# Patient Record
Sex: Female | Born: 1940 | Race: White | Hispanic: No | Marital: Married
Health system: Southern US, Community
[De-identification: ages and names within clinical notes are randomized; demographics above are authoritative.]

---

## 2015-11-10 ENCOUNTER — Other Ambulatory Visit (HOSPITAL_COMMUNITY): Payer: Self-pay | Admitting: Family Medicine

## 2015-11-10 DIAGNOSIS — Z1231 Encounter for screening mammogram for malignant neoplasm of breast: Secondary | ICD-10-CM

## 2015-11-25 ENCOUNTER — Ambulatory Visit (HOSPITAL_COMMUNITY)
Admission: RE | Admit: 2015-11-25 | Discharge: 2015-11-25 | Disposition: A | Discharge status: Home / Self Care | Payer: Medicare Other | Source: Ambulatory Visit | Attending: Family Medicine | Admitting: Family Medicine

## 2015-11-25 DIAGNOSIS — Z1231 Encounter for screening mammogram for malignant neoplasm of breast: Secondary | ICD-10-CM | POA: Diagnosis not present

## 2016-05-24 ENCOUNTER — Ambulatory Visit (HOSPITAL_COMMUNITY)
Admission: RE | Admit: 2016-05-24 | Discharge: 2016-05-24 | Disposition: A | Discharge status: Home / Self Care | Payer: Medicare Other | Source: Ambulatory Visit | Attending: Family Medicine | Admitting: Family Medicine

## 2016-05-24 ENCOUNTER — Other Ambulatory Visit (HOSPITAL_COMMUNITY): Payer: Self-pay | Admitting: Family Medicine

## 2016-05-24 DIAGNOSIS — M11261 Other chondrocalcinosis, right knee: Secondary | ICD-10-CM | POA: Diagnosis not present

## 2016-05-24 DIAGNOSIS — M25561 Pain in right knee: Secondary | ICD-10-CM

## 2018-09-14 ENCOUNTER — Other Ambulatory Visit: Payer: Self-pay | Admitting: *Deleted

## 2018-09-14 ENCOUNTER — Encounter: Payer: Self-pay | Admitting: Neurology

## 2018-09-14 DIAGNOSIS — M79641 Pain in right hand: Secondary | ICD-10-CM

## 2018-09-14 DIAGNOSIS — M79642 Pain in left hand: Principal | ICD-10-CM

## 2018-10-09 ENCOUNTER — Ambulatory Visit (INDEPENDENT_AMBULATORY_CARE_PROVIDER_SITE_OTHER): Payer: Medicare Other | Admitting: Neurology

## 2018-10-09 DIAGNOSIS — M79641 Pain in right hand: Secondary | ICD-10-CM

## 2018-10-09 DIAGNOSIS — M79642 Pain in left hand: Secondary | ICD-10-CM

## 2018-10-09 DIAGNOSIS — G5603 Carpal tunnel syndrome, bilateral upper limbs: Secondary | ICD-10-CM

## 2018-10-09 NOTE — Procedures (Signed)
[facility]  [address], [address]  [place], [place] [address] Tel: [phone] Fax:  [phone] Test Date:  10/09/2018  Patient: Brenda Reid DOB: 10/13/1940 Physician: Nita Sickleonika Caspar Favila, DO  Sex: Female Height: 5\' 4"  Ref Phys: Dairl PonderMatthew Weingold, MD  ID#: [id number] Temp: 34.0C Technician:    Patient Complaints: This is a 77 year old female referred for evaluation of bilateral hand paresthesias and weakness.  NCV & EMG Findings: Extensive electrodiagnostic testing of the right upper extremity and additional studies of the left shows:  1. Right median sensory response is absent.  Left median sensory response shows prolonged latency (4.9 ms) and normal amplitude.  Bilateral ulnar sensory responses are within normal limits. 2. Right median motor response is absent when stimulating at the median-wrist, however there is a very small motor response when stimulating the median nerve proximally as well as over the ulnar-wrist, consistent with a Martin-Gruber anastomosis.  Left median motor response shows mildly prolonged latency (4.5 ms) and evidence of a median-to-ulnar crossover in the forearm (i.e. Martin-Gruber anastomosis).  Bilateral ulnar motor responses show symmetrically reduced amplitude at the abductor digit he minimi and normal amplitude at the first dorsal interosseous muscles; ulnar motor latency and conduction velocities are within normal limits. 3. Fibrillation potentials are present in the right abductor pollicis brevis muscle and despite maximal activation, no motor unit recruitment is seen.  Needle electrode examination of the remaining tested muscles in the right and left upper extremities are within normal limits.  Particularly, there is no evidence of chronic motor axonal loss changes affecting any of the ulnar-innervated muscles, suggesting that a low ADM motor amplitude may be normal for patient.    Impression: 1. Right median neuropathy at or distal to the wrist,  consistent with a clinical diagnosis of carpal tunnel syndrome, which is very severe in degree electrically.   2. Left median neuropathy at or distal to the wrist, consistent with a clinical diagnosis of carpal tunnel syndrome, which is moderate in degree electrically.   3. 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   ___________________________ Nita Sickleonika Jaking Thayer, DO    Nerve Conduction Studies Anti Sensory Summary Table   Site NR Peak (ms) Norm Peak (ms) P-T Amp (V) Norm P-T Amp  Left Median Anti Sensory (2nd Digit)  34C  Wrist    4.9 <3.8 13.2 >10  Right Median Anti Sensory (2nd Digit)  34C  Wrist NR  <3.8  >10  Left Ulnar Anti Sensory (5th Digit)  34C  Wrist    3.0 <3.2 30.2 >5  Right Ulnar Anti Sensory (5th Digit)  34C  Wrist    2.7 <3.2 27.3 >5   Motor Summary Table   Site NR Onset (ms) Norm Onset (ms) O-P Amp (mV) Norm O-P Amp Site1 Site2 Delta-0 (ms) Dist (cm) Vel (m/s) Norm Vel (m/s)  Left Median Motor (Abd Poll Brev)  34C  Wrist    4.5 <4.0 5.6 >5 Elbow Wrist 5.3 27.0 51 >50  Elbow    9.8  5.4  Ulnar-wrist crossover Elbow 3.9 0.0    Ulnar-wrist crossover    5.9  3.0         Right Median Motor (Abd Poll Brev)  34C  Wrist NR  <4.0  >5 Elbow Wrist  25.0  >50  Elbow    9.7  1.8  Ulnar-wrist crossover Elbow 5.1 0.0    Ulnar-wrist crossover    4.6  1.8  Left Ulnar Motor (Abd Dig Minimi)  34C  Wrist    2.3 <3.1 4.2 >7 B Elbow Wrist 3.5 21.5 61 >50  B Elbow    5.8  4.6  A Elbow B Elbow 1.9 10.0 53 >50  A Elbow    7.7  4.2         Right Ulnar Motor (Abd Dig Minimi)  34C  Wrist    2.3 <3.1 5.4 >7 B Elbow Wrist 3.5 21.5 61 >50  B Elbow    5.8  5.0  A Elbow B Elbow 1.8 10.0 56 >50  A Elbow    7.6  4.7         Left Ulnar (FDI) Motor (1st DI)  34C  Wrist    3.7 <4.5 7.6 >7 B Elbow Wrist 4.1 21.5 52 >50  B Elbow    7.8  7.2  A Elbow B Elbow 2.0 10.0 50 >50  A Elbow    9.8  6.5         Right Ulnar (FDI) Motor (1st DI)   34C  Wrist    4.1 <4.5 9.4 >7 B Elbow Wrist 3.8 22.5 59 >50  B Elbow    7.9  9.0  A Elbow B Elbow 1.9 10.0 53 >50  A Elbow    9.8  8.4          EMG   Side Muscle Ins Act Fibs Psw Fasc Number Recrt Dur Dur. Amp Amp. Poly Poly. Comment  Right 1stDorInt Nml Nml Nml Nml Nml Nml Nml Nml Nml Nml Nml Nml N/A  Right Abd Poll Brev Nml 1+ Nml Nml NE None - - - - - - ATR  Right PronatorTeres Nml Nml Nml Nml Nml Nml Nml Nml Nml Nml Nml Nml N/A  Right ABD Dig Min Nml Nml Nml Nml Nml Nml Nml Nml Nml Nml Nml Nml N/A  Right FlexCarpiUln Nml Nml Nml Nml Nml Nml Nml Nml Nml Nml Nml Nml N/A  Right Biceps Nml Nml Nml Nml Nml Nml Nml Nml Nml Nml Nml Nml N/A  Right Triceps Nml Nml Nml Nml Nml Nml Nml Nml Nml Nml Nml Nml N/A  Right Deltoid Nml Nml Nml Nml Nml Nml Nml Nml Nml Nml Nml Nml N/A  Left 1stDorInt Nml Nml Nml Nml Nml Nml Nml Nml Nml Nml Nml Nml N/A  Left Abd Poll Brev Nml Nml Nml Nml Nml Nml Nml Nml Nml Nml Nml Nml N/A  Left PronatorTeres Nml Nml Nml Nml Nml Nml Nml Nml Nml Nml Nml Nml N/A  Left Biceps Nml Nml Nml Nml Nml Nml Nml Nml Nml Nml Nml Nml N/A  Left Triceps Nml Nml Nml Nml Nml Nml Nml Nml Nml Nml Nml Nml N/A  Left Deltoid Nml Nml Nml Nml Nml Nml Nml Nml Nml Nml Nml Nml N/A  Left ABD Dig Min Nml Nml Nml Nml Nml Nml Nml Nml Nml Nml Nml Nml N/A  Left FlexCarpiUln Nml Nml Nml Nml Nml Nml Nml Nml Nml Nml Nml Nml N/A      Waveforms:

## 2019-09-23 ENCOUNTER — Other Ambulatory Visit: Payer: Self-pay

## 2019-09-23 ENCOUNTER — Other Ambulatory Visit (HOSPITAL_COMMUNITY): Payer: Self-pay | Admitting: Nurse Practitioner

## 2019-09-23 ENCOUNTER — Encounter (HOSPITAL_COMMUNITY): Payer: Self-pay

## 2019-09-23 ENCOUNTER — Ambulatory Visit (HOSPITAL_COMMUNITY)
Admission: RE | Admit: 2019-09-23 | Discharge: 2019-09-23 | Disposition: A | Discharge status: Home / Self Care | Payer: Medicare Other | Source: Ambulatory Visit | Attending: Nurse Practitioner | Admitting: Nurse Practitioner

## 2019-09-23 DIAGNOSIS — M5440 Lumbago with sciatica, unspecified side: Secondary | ICD-10-CM

## 2019-09-23 DIAGNOSIS — R059 Cough, unspecified: Secondary | ICD-10-CM

## 2019-09-23 DIAGNOSIS — R05 Cough: Secondary | ICD-10-CM

## 2020-04-08 ENCOUNTER — Encounter (INDEPENDENT_AMBULATORY_CARE_PROVIDER_SITE_OTHER): Payer: Medicare Other | Admitting: Ophthalmology

## 2020-04-08 ENCOUNTER — Other Ambulatory Visit: Payer: Self-pay

## 2020-04-08 DIAGNOSIS — H353122 Nonexudative age-related macular degeneration, left eye, intermediate dry stage: Secondary | ICD-10-CM | POA: Diagnosis not present

## 2020-04-08 DIAGNOSIS — H353211 Exudative age-related macular degeneration, right eye, with active choroidal neovascularization: Secondary | ICD-10-CM | POA: Diagnosis not present

## 2020-04-08 DIAGNOSIS — H43813 Vitreous degeneration, bilateral: Secondary | ICD-10-CM

## 2020-05-07 ENCOUNTER — Other Ambulatory Visit: Payer: Self-pay

## 2020-05-07 ENCOUNTER — Encounter (INDEPENDENT_AMBULATORY_CARE_PROVIDER_SITE_OTHER): Payer: Medicare Other | Admitting: Ophthalmology

## 2020-05-07 DIAGNOSIS — H2513 Age-related nuclear cataract, bilateral: Secondary | ICD-10-CM

## 2020-05-07 DIAGNOSIS — H43813 Vitreous degeneration, bilateral: Secondary | ICD-10-CM

## 2020-05-07 DIAGNOSIS — H353211 Exudative age-related macular degeneration, right eye, with active choroidal neovascularization: Secondary | ICD-10-CM | POA: Diagnosis not present

## 2020-05-07 DIAGNOSIS — H353121 Nonexudative age-related macular degeneration, left eye, early dry stage: Secondary | ICD-10-CM | POA: Diagnosis not present

## 2020-06-03 ENCOUNTER — Encounter (INDEPENDENT_AMBULATORY_CARE_PROVIDER_SITE_OTHER): Payer: Medicare Other | Admitting: Ophthalmology

## 2020-06-03 ENCOUNTER — Other Ambulatory Visit: Payer: Self-pay

## 2020-06-03 DIAGNOSIS — H43813 Vitreous degeneration, bilateral: Secondary | ICD-10-CM | POA: Diagnosis not present

## 2020-06-03 DIAGNOSIS — H353211 Exudative age-related macular degeneration, right eye, with active choroidal neovascularization: Secondary | ICD-10-CM

## 2020-06-03 DIAGNOSIS — H353122 Nonexudative age-related macular degeneration, left eye, intermediate dry stage: Secondary | ICD-10-CM | POA: Diagnosis not present

## 2020-06-03 DIAGNOSIS — H2513 Age-related nuclear cataract, bilateral: Secondary | ICD-10-CM | POA: Diagnosis not present

## 2020-07-02 ENCOUNTER — Other Ambulatory Visit: Payer: Self-pay

## 2020-07-02 ENCOUNTER — Encounter (INDEPENDENT_AMBULATORY_CARE_PROVIDER_SITE_OTHER): Payer: Medicare Other | Admitting: Ophthalmology

## 2020-07-02 DIAGNOSIS — H353211 Exudative age-related macular degeneration, right eye, with active choroidal neovascularization: Secondary | ICD-10-CM | POA: Diagnosis not present

## 2020-07-02 DIAGNOSIS — H353122 Nonexudative age-related macular degeneration, left eye, intermediate dry stage: Secondary | ICD-10-CM | POA: Diagnosis not present

## 2020-07-02 DIAGNOSIS — H43813 Vitreous degeneration, bilateral: Secondary | ICD-10-CM

## 2020-07-28 ENCOUNTER — Encounter (INDEPENDENT_AMBULATORY_CARE_PROVIDER_SITE_OTHER): Payer: Medicare Other | Admitting: Ophthalmology

## 2020-07-28 ENCOUNTER — Other Ambulatory Visit: Payer: Self-pay

## 2020-07-28 DIAGNOSIS — H353211 Exudative age-related macular degeneration, right eye, with active choroidal neovascularization: Secondary | ICD-10-CM

## 2020-07-28 DIAGNOSIS — H43813 Vitreous degeneration, bilateral: Secondary | ICD-10-CM

## 2020-07-28 DIAGNOSIS — H353122 Nonexudative age-related macular degeneration, left eye, intermediate dry stage: Secondary | ICD-10-CM | POA: Diagnosis not present

## 2020-08-27 ENCOUNTER — Encounter (INDEPENDENT_AMBULATORY_CARE_PROVIDER_SITE_OTHER): Payer: Medicare Other | Admitting: Ophthalmology

## 2020-08-27 ENCOUNTER — Other Ambulatory Visit: Payer: Self-pay

## 2020-08-27 DIAGNOSIS — H353121 Nonexudative age-related macular degeneration, left eye, early dry stage: Secondary | ICD-10-CM

## 2020-08-27 DIAGNOSIS — H43813 Vitreous degeneration, bilateral: Secondary | ICD-10-CM

## 2020-08-27 DIAGNOSIS — H353211 Exudative age-related macular degeneration, right eye, with active choroidal neovascularization: Secondary | ICD-10-CM | POA: Diagnosis not present

## 2020-09-24 ENCOUNTER — Other Ambulatory Visit: Payer: Self-pay

## 2020-09-24 ENCOUNTER — Encounter (INDEPENDENT_AMBULATORY_CARE_PROVIDER_SITE_OTHER): Payer: Medicare Other | Admitting: Ophthalmology

## 2020-09-24 DIAGNOSIS — H353121 Nonexudative age-related macular degeneration, left eye, early dry stage: Secondary | ICD-10-CM

## 2020-09-24 DIAGNOSIS — H43813 Vitreous degeneration, bilateral: Secondary | ICD-10-CM

## 2020-09-24 DIAGNOSIS — H353211 Exudative age-related macular degeneration, right eye, with active choroidal neovascularization: Secondary | ICD-10-CM | POA: Diagnosis not present

## 2020-10-22 ENCOUNTER — Encounter (INDEPENDENT_AMBULATORY_CARE_PROVIDER_SITE_OTHER): Payer: Medicare Other | Admitting: Ophthalmology

## 2020-10-22 ENCOUNTER — Other Ambulatory Visit: Payer: Self-pay

## 2020-10-22 DIAGNOSIS — H353211 Exudative age-related macular degeneration, right eye, with active choroidal neovascularization: Secondary | ICD-10-CM | POA: Diagnosis not present

## 2020-10-22 DIAGNOSIS — H353122 Nonexudative age-related macular degeneration, left eye, intermediate dry stage: Secondary | ICD-10-CM | POA: Diagnosis not present

## 2020-10-22 DIAGNOSIS — H43813 Vitreous degeneration, bilateral: Secondary | ICD-10-CM | POA: Diagnosis not present

## 2020-11-18 ENCOUNTER — Encounter (INDEPENDENT_AMBULATORY_CARE_PROVIDER_SITE_OTHER): Payer: Medicare Other | Admitting: Ophthalmology

## 2020-11-18 ENCOUNTER — Other Ambulatory Visit: Payer: Self-pay

## 2020-11-18 DIAGNOSIS — H353122 Nonexudative age-related macular degeneration, left eye, intermediate dry stage: Secondary | ICD-10-CM | POA: Diagnosis not present

## 2020-11-18 DIAGNOSIS — H353211 Exudative age-related macular degeneration, right eye, with active choroidal neovascularization: Secondary | ICD-10-CM | POA: Diagnosis not present

## 2020-11-18 DIAGNOSIS — H43813 Vitreous degeneration, bilateral: Secondary | ICD-10-CM | POA: Diagnosis not present

## 2020-12-16 ENCOUNTER — Other Ambulatory Visit: Payer: Self-pay

## 2020-12-16 ENCOUNTER — Encounter (INDEPENDENT_AMBULATORY_CARE_PROVIDER_SITE_OTHER): Payer: Medicare Other | Admitting: Ophthalmology

## 2020-12-16 DIAGNOSIS — H43813 Vitreous degeneration, bilateral: Secondary | ICD-10-CM

## 2020-12-16 DIAGNOSIS — H353211 Exudative age-related macular degeneration, right eye, with active choroidal neovascularization: Secondary | ICD-10-CM

## 2020-12-16 DIAGNOSIS — H353122 Nonexudative age-related macular degeneration, left eye, intermediate dry stage: Secondary | ICD-10-CM

## 2021-01-13 ENCOUNTER — Encounter (INDEPENDENT_AMBULATORY_CARE_PROVIDER_SITE_OTHER): Payer: Medicare Other | Admitting: Ophthalmology

## 2021-01-13 ENCOUNTER — Other Ambulatory Visit: Payer: Self-pay

## 2021-01-13 DIAGNOSIS — H43813 Vitreous degeneration, bilateral: Secondary | ICD-10-CM | POA: Diagnosis not present

## 2021-01-13 DIAGNOSIS — H353211 Exudative age-related macular degeneration, right eye, with active choroidal neovascularization: Secondary | ICD-10-CM | POA: Diagnosis not present

## 2021-01-13 DIAGNOSIS — H353122 Nonexudative age-related macular degeneration, left eye, intermediate dry stage: Secondary | ICD-10-CM

## 2021-02-10 ENCOUNTER — Other Ambulatory Visit: Payer: Self-pay

## 2021-02-10 ENCOUNTER — Encounter (INDEPENDENT_AMBULATORY_CARE_PROVIDER_SITE_OTHER): Payer: Medicare Other | Admitting: Ophthalmology

## 2021-02-10 DIAGNOSIS — H353211 Exudative age-related macular degeneration, right eye, with active choroidal neovascularization: Secondary | ICD-10-CM | POA: Diagnosis not present

## 2021-02-10 DIAGNOSIS — H353122 Nonexudative age-related macular degeneration, left eye, intermediate dry stage: Secondary | ICD-10-CM | POA: Diagnosis not present

## 2021-02-10 DIAGNOSIS — H43813 Vitreous degeneration, bilateral: Secondary | ICD-10-CM

## 2021-03-11 ENCOUNTER — Encounter (INDEPENDENT_AMBULATORY_CARE_PROVIDER_SITE_OTHER): Payer: Medicare Other | Admitting: Ophthalmology

## 2021-03-11 ENCOUNTER — Other Ambulatory Visit: Payer: Self-pay

## 2021-03-11 DIAGNOSIS — H43813 Vitreous degeneration, bilateral: Secondary | ICD-10-CM

## 2021-03-11 DIAGNOSIS — H353211 Exudative age-related macular degeneration, right eye, with active choroidal neovascularization: Secondary | ICD-10-CM

## 2021-03-11 DIAGNOSIS — H353122 Nonexudative age-related macular degeneration, left eye, intermediate dry stage: Secondary | ICD-10-CM

## 2021-04-08 ENCOUNTER — Other Ambulatory Visit: Payer: Self-pay

## 2021-04-08 ENCOUNTER — Encounter (INDEPENDENT_AMBULATORY_CARE_PROVIDER_SITE_OTHER): Payer: Medicare Other | Admitting: Ophthalmology

## 2021-04-08 DIAGNOSIS — H43813 Vitreous degeneration, bilateral: Secondary | ICD-10-CM

## 2021-04-08 DIAGNOSIS — H353211 Exudative age-related macular degeneration, right eye, with active choroidal neovascularization: Secondary | ICD-10-CM

## 2021-04-08 DIAGNOSIS — H353122 Nonexudative age-related macular degeneration, left eye, intermediate dry stage: Secondary | ICD-10-CM | POA: Diagnosis not present

## 2021-05-07 ENCOUNTER — Encounter (INDEPENDENT_AMBULATORY_CARE_PROVIDER_SITE_OTHER): Payer: Medicare Other | Admitting: Ophthalmology

## 2021-05-17 ENCOUNTER — Encounter (INDEPENDENT_AMBULATORY_CARE_PROVIDER_SITE_OTHER): Payer: Medicare Other | Admitting: Ophthalmology

## 2021-05-17 ENCOUNTER — Other Ambulatory Visit: Payer: Self-pay

## 2021-05-17 DIAGNOSIS — H353211 Exudative age-related macular degeneration, right eye, with active choroidal neovascularization: Secondary | ICD-10-CM | POA: Diagnosis not present

## 2021-05-17 DIAGNOSIS — H353122 Nonexudative age-related macular degeneration, left eye, intermediate dry stage: Secondary | ICD-10-CM | POA: Diagnosis not present

## 2021-05-17 DIAGNOSIS — H43813 Vitreous degeneration, bilateral: Secondary | ICD-10-CM

## 2021-05-21 IMAGING — DX DG THORACIC SPINE 3V
4 series · 4 of 4 positions shown · non-contrast
Comparison: None.

CLINICAL DATA: Upper back pain

EXAM:
THORACIC SPINE - 3 VIEWS

[t-spine ap (1 of 2)]
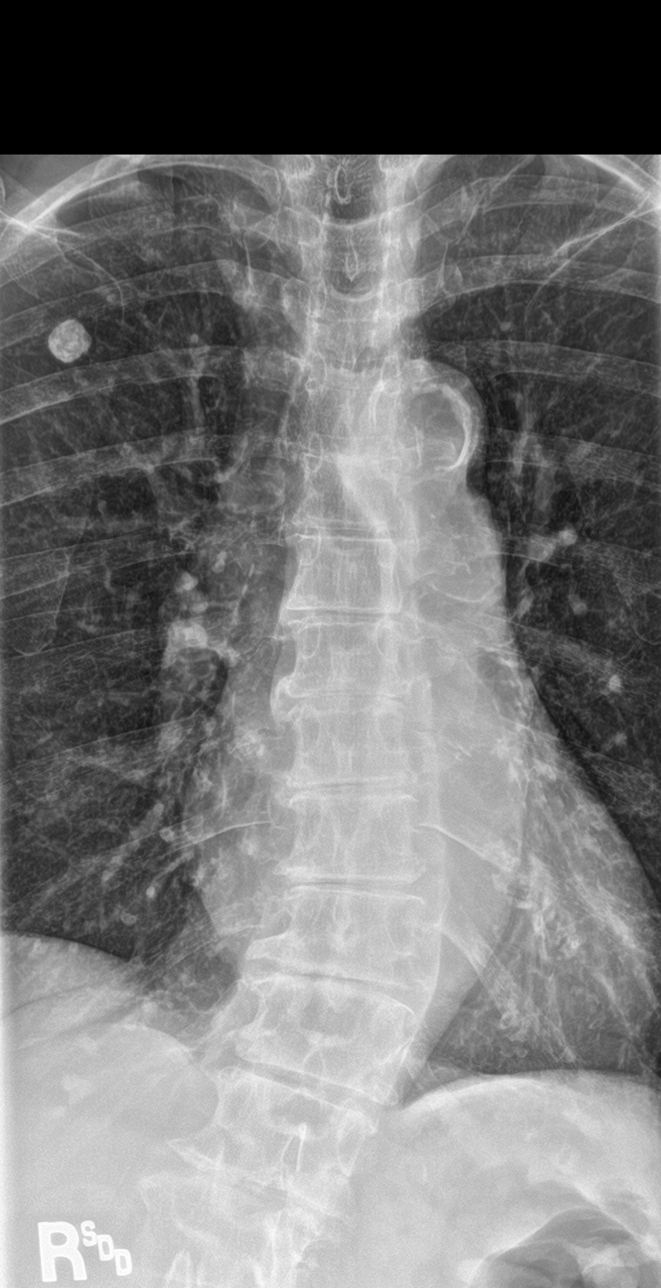

[t-spine swimmers]
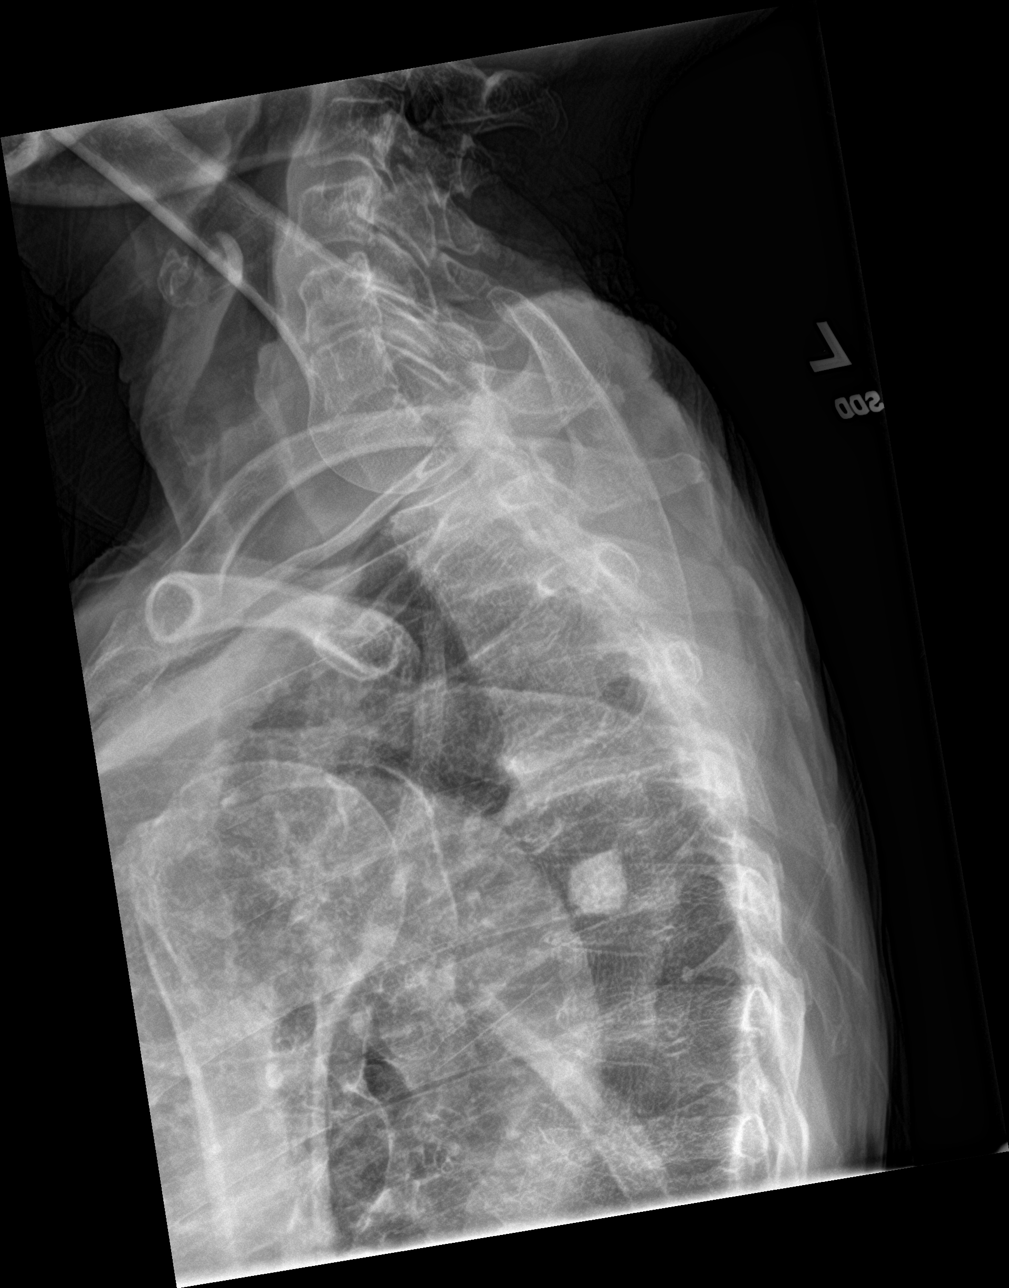

[t-spine ap (2 of 2)]
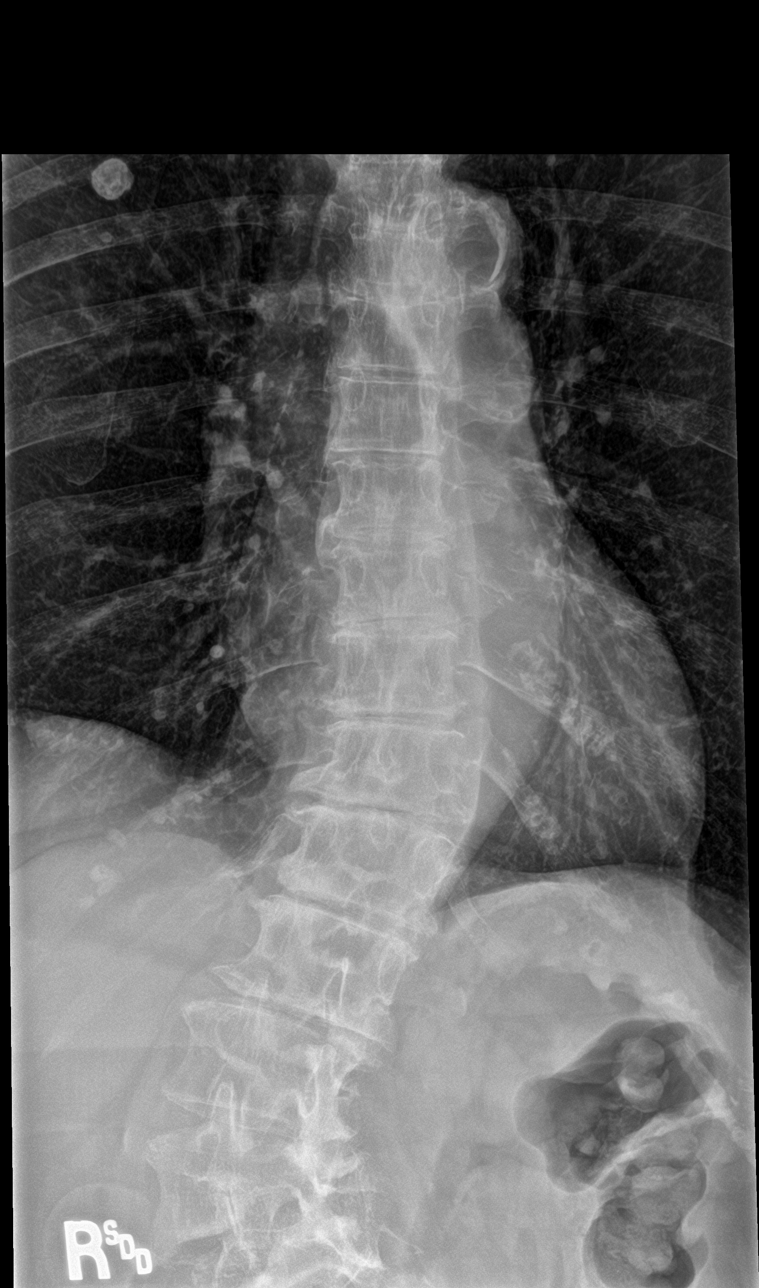

[t-spine lat]
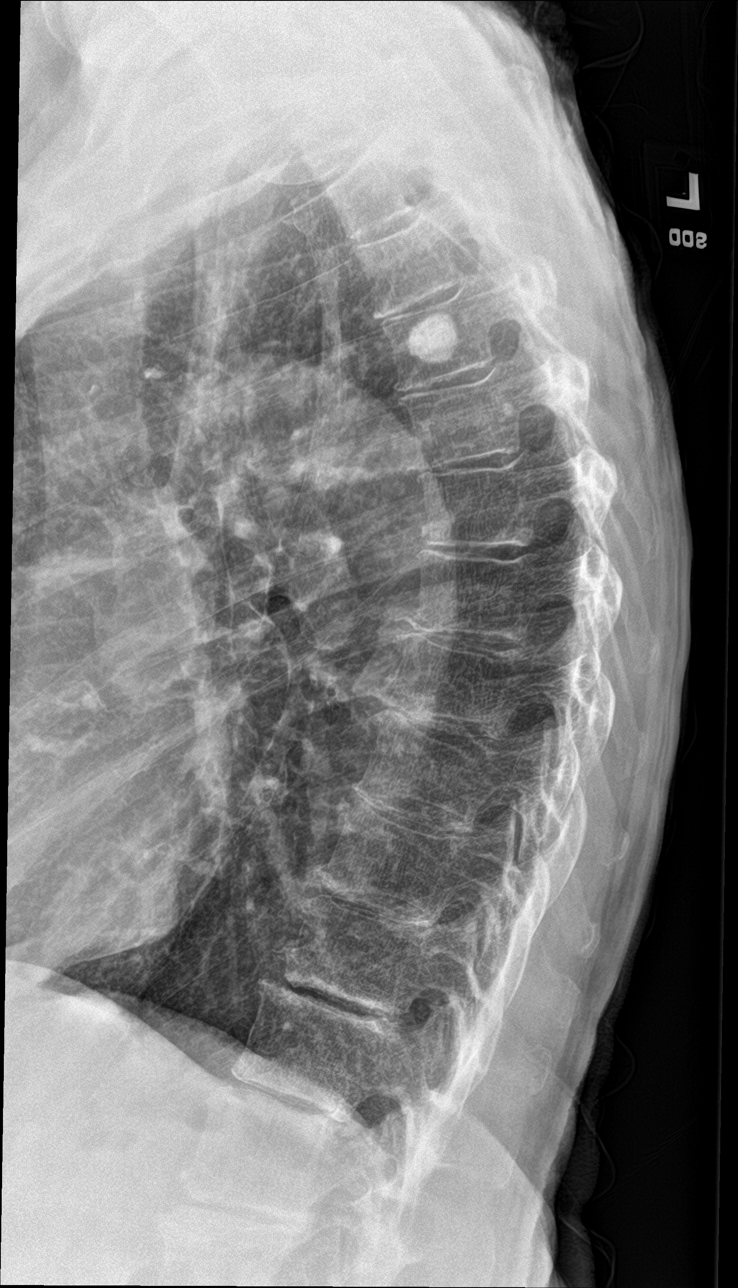

[4 of 4 positions shown; findings below may reference images not displayed]

FINDINGS: Levocurvature of the lower thoracic spine with dextrocurvature of
the visualized upper lumbar spine. Vertebral body heights are
maintained. There is mild lower thoracic retrolisthesis likely
secondary to facet arthropathy. Mild multilevel disc height loss.
Chest is dictated separately.
IMPRESSION: Sigmoid curvature of the thoracolumbar spine. Multilevel
degenerative changes.

## 2021-06-15 ENCOUNTER — Encounter (INDEPENDENT_AMBULATORY_CARE_PROVIDER_SITE_OTHER): Payer: Medicare Other | Admitting: Ophthalmology

## 2021-06-15 ENCOUNTER — Other Ambulatory Visit: Payer: Self-pay

## 2021-06-15 DIAGNOSIS — H353122 Nonexudative age-related macular degeneration, left eye, intermediate dry stage: Secondary | ICD-10-CM | POA: Diagnosis not present

## 2021-06-15 DIAGNOSIS — H353211 Exudative age-related macular degeneration, right eye, with active choroidal neovascularization: Secondary | ICD-10-CM

## 2021-06-15 DIAGNOSIS — H43813 Vitreous degeneration, bilateral: Secondary | ICD-10-CM | POA: Diagnosis not present

## 2021-07-14 ENCOUNTER — Encounter (INDEPENDENT_AMBULATORY_CARE_PROVIDER_SITE_OTHER): Payer: Medicare Other | Admitting: Ophthalmology

## 2021-07-14 ENCOUNTER — Other Ambulatory Visit: Payer: Self-pay

## 2021-07-14 DIAGNOSIS — H43813 Vitreous degeneration, bilateral: Secondary | ICD-10-CM

## 2021-07-14 DIAGNOSIS — H353121 Nonexudative age-related macular degeneration, left eye, early dry stage: Secondary | ICD-10-CM | POA: Diagnosis not present

## 2021-07-14 DIAGNOSIS — H353211 Exudative age-related macular degeneration, right eye, with active choroidal neovascularization: Secondary | ICD-10-CM | POA: Diagnosis not present

## 2021-08-11 ENCOUNTER — Other Ambulatory Visit: Payer: Self-pay

## 2021-08-11 ENCOUNTER — Encounter (INDEPENDENT_AMBULATORY_CARE_PROVIDER_SITE_OTHER): Payer: Medicare Other | Admitting: Ophthalmology

## 2021-08-11 DIAGNOSIS — H353211 Exudative age-related macular degeneration, right eye, with active choroidal neovascularization: Secondary | ICD-10-CM

## 2021-08-11 DIAGNOSIS — H353122 Nonexudative age-related macular degeneration, left eye, intermediate dry stage: Secondary | ICD-10-CM

## 2021-08-11 DIAGNOSIS — H43813 Vitreous degeneration, bilateral: Secondary | ICD-10-CM | POA: Diagnosis not present

## 2021-09-08 ENCOUNTER — Other Ambulatory Visit: Payer: Self-pay

## 2021-09-08 ENCOUNTER — Encounter (INDEPENDENT_AMBULATORY_CARE_PROVIDER_SITE_OTHER): Payer: Medicare Other | Admitting: Ophthalmology

## 2021-09-08 DIAGNOSIS — H43813 Vitreous degeneration, bilateral: Secondary | ICD-10-CM

## 2021-09-08 DIAGNOSIS — H353122 Nonexudative age-related macular degeneration, left eye, intermediate dry stage: Secondary | ICD-10-CM

## 2021-09-08 DIAGNOSIS — H353211 Exudative age-related macular degeneration, right eye, with active choroidal neovascularization: Secondary | ICD-10-CM | POA: Diagnosis not present

## 2021-10-13 ENCOUNTER — Encounter (INDEPENDENT_AMBULATORY_CARE_PROVIDER_SITE_OTHER): Payer: Medicare Other | Admitting: Ophthalmology

## 2021-10-13 ENCOUNTER — Other Ambulatory Visit: Payer: Self-pay

## 2021-10-13 DIAGNOSIS — H43813 Vitreous degeneration, bilateral: Secondary | ICD-10-CM

## 2021-10-13 DIAGNOSIS — H353122 Nonexudative age-related macular degeneration, left eye, intermediate dry stage: Secondary | ICD-10-CM | POA: Diagnosis not present

## 2021-10-13 DIAGNOSIS — H353211 Exudative age-related macular degeneration, right eye, with active choroidal neovascularization: Secondary | ICD-10-CM | POA: Diagnosis not present

## 2021-11-10 ENCOUNTER — Encounter (INDEPENDENT_AMBULATORY_CARE_PROVIDER_SITE_OTHER): Payer: Medicare Other | Admitting: Ophthalmology

## 2021-11-10 ENCOUNTER — Other Ambulatory Visit: Payer: Self-pay

## 2021-11-10 DIAGNOSIS — H353121 Nonexudative age-related macular degeneration, left eye, early dry stage: Secondary | ICD-10-CM | POA: Diagnosis not present

## 2021-11-10 DIAGNOSIS — H43813 Vitreous degeneration, bilateral: Secondary | ICD-10-CM

## 2021-11-10 DIAGNOSIS — H353211 Exudative age-related macular degeneration, right eye, with active choroidal neovascularization: Secondary | ICD-10-CM | POA: Diagnosis not present

## 2021-12-08 ENCOUNTER — Other Ambulatory Visit: Payer: Self-pay

## 2021-12-08 ENCOUNTER — Encounter (INDEPENDENT_AMBULATORY_CARE_PROVIDER_SITE_OTHER): Payer: Medicare Other | Admitting: Ophthalmology

## 2021-12-08 DIAGNOSIS — H353211 Exudative age-related macular degeneration, right eye, with active choroidal neovascularization: Secondary | ICD-10-CM

## 2021-12-08 DIAGNOSIS — H353122 Nonexudative age-related macular degeneration, left eye, intermediate dry stage: Secondary | ICD-10-CM

## 2021-12-08 DIAGNOSIS — H43813 Vitreous degeneration, bilateral: Secondary | ICD-10-CM

## 2022-01-05 ENCOUNTER — Encounter (INDEPENDENT_AMBULATORY_CARE_PROVIDER_SITE_OTHER): Payer: Medicare Other | Admitting: Ophthalmology

## 2022-01-05 DIAGNOSIS — H353211 Exudative age-related macular degeneration, right eye, with active choroidal neovascularization: Secondary | ICD-10-CM

## 2022-01-05 DIAGNOSIS — H43813 Vitreous degeneration, bilateral: Secondary | ICD-10-CM | POA: Diagnosis not present

## 2022-01-05 DIAGNOSIS — H353122 Nonexudative age-related macular degeneration, left eye, intermediate dry stage: Secondary | ICD-10-CM | POA: Diagnosis not present

## 2022-02-02 ENCOUNTER — Encounter (INDEPENDENT_AMBULATORY_CARE_PROVIDER_SITE_OTHER): Payer: Medicare Other | Admitting: Ophthalmology

## 2022-02-02 DIAGNOSIS — H43813 Vitreous degeneration, bilateral: Secondary | ICD-10-CM

## 2022-02-02 DIAGNOSIS — H353211 Exudative age-related macular degeneration, right eye, with active choroidal neovascularization: Secondary | ICD-10-CM | POA: Diagnosis not present

## 2022-02-02 DIAGNOSIS — H353122 Nonexudative age-related macular degeneration, left eye, intermediate dry stage: Secondary | ICD-10-CM | POA: Diagnosis not present

## 2022-03-02 ENCOUNTER — Encounter (INDEPENDENT_AMBULATORY_CARE_PROVIDER_SITE_OTHER): Payer: Medicare Other | Admitting: Ophthalmology

## 2022-03-02 DIAGNOSIS — H353211 Exudative age-related macular degeneration, right eye, with active choroidal neovascularization: Secondary | ICD-10-CM | POA: Diagnosis not present

## 2022-03-02 DIAGNOSIS — H43813 Vitreous degeneration, bilateral: Secondary | ICD-10-CM

## 2022-03-02 DIAGNOSIS — H353122 Nonexudative age-related macular degeneration, left eye, intermediate dry stage: Secondary | ICD-10-CM | POA: Diagnosis not present

## 2022-03-30 ENCOUNTER — Encounter (INDEPENDENT_AMBULATORY_CARE_PROVIDER_SITE_OTHER): Payer: Medicare Other | Admitting: Ophthalmology

## 2022-03-30 DIAGNOSIS — H43813 Vitreous degeneration, bilateral: Secondary | ICD-10-CM

## 2022-03-30 DIAGNOSIS — H353122 Nonexudative age-related macular degeneration, left eye, intermediate dry stage: Secondary | ICD-10-CM | POA: Diagnosis not present

## 2022-03-30 DIAGNOSIS — H353211 Exudative age-related macular degeneration, right eye, with active choroidal neovascularization: Secondary | ICD-10-CM

## 2022-04-27 ENCOUNTER — Encounter (INDEPENDENT_AMBULATORY_CARE_PROVIDER_SITE_OTHER): Payer: Medicare Other | Admitting: Ophthalmology

## 2022-04-27 DIAGNOSIS — H353211 Exudative age-related macular degeneration, right eye, with active choroidal neovascularization: Secondary | ICD-10-CM

## 2022-04-27 DIAGNOSIS — H353122 Nonexudative age-related macular degeneration, left eye, intermediate dry stage: Secondary | ICD-10-CM

## 2022-04-27 DIAGNOSIS — H43813 Vitreous degeneration, bilateral: Secondary | ICD-10-CM | POA: Diagnosis not present

## 2022-05-25 ENCOUNTER — Encounter (INDEPENDENT_AMBULATORY_CARE_PROVIDER_SITE_OTHER): Payer: Medicare Other | Admitting: Ophthalmology

## 2022-05-25 DIAGNOSIS — H353211 Exudative age-related macular degeneration, right eye, with active choroidal neovascularization: Secondary | ICD-10-CM

## 2022-05-25 DIAGNOSIS — H353122 Nonexudative age-related macular degeneration, left eye, intermediate dry stage: Secondary | ICD-10-CM | POA: Diagnosis not present

## 2022-05-25 DIAGNOSIS — H43813 Vitreous degeneration, bilateral: Secondary | ICD-10-CM

## 2022-06-22 ENCOUNTER — Encounter (INDEPENDENT_AMBULATORY_CARE_PROVIDER_SITE_OTHER): Payer: Medicare Other | Admitting: Ophthalmology

## 2022-06-22 DIAGNOSIS — H43813 Vitreous degeneration, bilateral: Secondary | ICD-10-CM

## 2022-06-22 DIAGNOSIS — H353122 Nonexudative age-related macular degeneration, left eye, intermediate dry stage: Secondary | ICD-10-CM | POA: Diagnosis not present

## 2022-06-22 DIAGNOSIS — H353211 Exudative age-related macular degeneration, right eye, with active choroidal neovascularization: Secondary | ICD-10-CM

## 2022-07-20 ENCOUNTER — Encounter (INDEPENDENT_AMBULATORY_CARE_PROVIDER_SITE_OTHER): Payer: Medicare Other | Admitting: Ophthalmology

## 2022-07-20 DIAGNOSIS — H353122 Nonexudative age-related macular degeneration, left eye, intermediate dry stage: Secondary | ICD-10-CM

## 2022-07-20 DIAGNOSIS — H353211 Exudative age-related macular degeneration, right eye, with active choroidal neovascularization: Secondary | ICD-10-CM | POA: Diagnosis not present

## 2022-07-20 DIAGNOSIS — H43813 Vitreous degeneration, bilateral: Secondary | ICD-10-CM

## 2022-08-17 ENCOUNTER — Encounter (INDEPENDENT_AMBULATORY_CARE_PROVIDER_SITE_OTHER): Payer: Medicare Other | Admitting: Ophthalmology

## 2022-08-17 DIAGNOSIS — H353211 Exudative age-related macular degeneration, right eye, with active choroidal neovascularization: Secondary | ICD-10-CM | POA: Diagnosis not present

## 2022-08-17 DIAGNOSIS — H43813 Vitreous degeneration, bilateral: Secondary | ICD-10-CM | POA: Diagnosis not present

## 2022-08-17 DIAGNOSIS — H353122 Nonexudative age-related macular degeneration, left eye, intermediate dry stage: Secondary | ICD-10-CM | POA: Diagnosis not present

## 2022-09-14 ENCOUNTER — Encounter (INDEPENDENT_AMBULATORY_CARE_PROVIDER_SITE_OTHER): Payer: Medicare Other | Admitting: Ophthalmology

## 2022-09-14 DIAGNOSIS — H353122 Nonexudative age-related macular degeneration, left eye, intermediate dry stage: Secondary | ICD-10-CM | POA: Diagnosis not present

## 2022-09-14 DIAGNOSIS — H43813 Vitreous degeneration, bilateral: Secondary | ICD-10-CM | POA: Diagnosis not present

## 2022-09-14 DIAGNOSIS — H353211 Exudative age-related macular degeneration, right eye, with active choroidal neovascularization: Secondary | ICD-10-CM | POA: Diagnosis not present

## 2022-10-12 ENCOUNTER — Encounter (INDEPENDENT_AMBULATORY_CARE_PROVIDER_SITE_OTHER): Payer: Medicare Other | Admitting: Ophthalmology

## 2022-10-12 DIAGNOSIS — H353122 Nonexudative age-related macular degeneration, left eye, intermediate dry stage: Secondary | ICD-10-CM

## 2022-10-12 DIAGNOSIS — H353211 Exudative age-related macular degeneration, right eye, with active choroidal neovascularization: Secondary | ICD-10-CM

## 2022-10-12 DIAGNOSIS — H43813 Vitreous degeneration, bilateral: Secondary | ICD-10-CM | POA: Diagnosis not present

## 2022-11-09 ENCOUNTER — Encounter (INDEPENDENT_AMBULATORY_CARE_PROVIDER_SITE_OTHER): Payer: Medicare Other | Admitting: Ophthalmology

## 2022-11-09 DIAGNOSIS — H353122 Nonexudative age-related macular degeneration, left eye, intermediate dry stage: Secondary | ICD-10-CM

## 2022-11-09 DIAGNOSIS — H43813 Vitreous degeneration, bilateral: Secondary | ICD-10-CM | POA: Diagnosis not present

## 2022-11-09 DIAGNOSIS — H353211 Exudative age-related macular degeneration, right eye, with active choroidal neovascularization: Secondary | ICD-10-CM | POA: Diagnosis not present

## 2022-12-07 ENCOUNTER — Encounter (INDEPENDENT_AMBULATORY_CARE_PROVIDER_SITE_OTHER): Payer: Medicare Other | Admitting: Ophthalmology

## 2022-12-14 ENCOUNTER — Encounter (INDEPENDENT_AMBULATORY_CARE_PROVIDER_SITE_OTHER): Payer: Medicare Other | Admitting: Ophthalmology

## 2022-12-14 DIAGNOSIS — H43813 Vitreous degeneration, bilateral: Secondary | ICD-10-CM

## 2022-12-14 DIAGNOSIS — H353122 Nonexudative age-related macular degeneration, left eye, intermediate dry stage: Secondary | ICD-10-CM

## 2022-12-14 DIAGNOSIS — H353211 Exudative age-related macular degeneration, right eye, with active choroidal neovascularization: Secondary | ICD-10-CM

## 2023-01-11 ENCOUNTER — Encounter (INDEPENDENT_AMBULATORY_CARE_PROVIDER_SITE_OTHER): Payer: Medicare Other | Admitting: Ophthalmology

## 2023-01-11 DIAGNOSIS — H353211 Exudative age-related macular degeneration, right eye, with active choroidal neovascularization: Secondary | ICD-10-CM | POA: Diagnosis not present

## 2023-01-11 DIAGNOSIS — H43813 Vitreous degeneration, bilateral: Secondary | ICD-10-CM | POA: Diagnosis not present

## 2023-01-11 DIAGNOSIS — H353122 Nonexudative age-related macular degeneration, left eye, intermediate dry stage: Secondary | ICD-10-CM

## 2023-02-08 ENCOUNTER — Encounter (INDEPENDENT_AMBULATORY_CARE_PROVIDER_SITE_OTHER): Payer: Medicare Other | Admitting: Ophthalmology

## 2023-02-08 DIAGNOSIS — H43813 Vitreous degeneration, bilateral: Secondary | ICD-10-CM

## 2023-02-08 DIAGNOSIS — H353211 Exudative age-related macular degeneration, right eye, with active choroidal neovascularization: Secondary | ICD-10-CM | POA: Diagnosis not present

## 2023-02-08 DIAGNOSIS — H353122 Nonexudative age-related macular degeneration, left eye, intermediate dry stage: Secondary | ICD-10-CM

## 2023-03-08 ENCOUNTER — Encounter (INDEPENDENT_AMBULATORY_CARE_PROVIDER_SITE_OTHER): Payer: Medicare Other | Admitting: Ophthalmology

## 2023-03-08 DIAGNOSIS — H43813 Vitreous degeneration, bilateral: Secondary | ICD-10-CM

## 2023-03-08 DIAGNOSIS — H353211 Exudative age-related macular degeneration, right eye, with active choroidal neovascularization: Secondary | ICD-10-CM | POA: Diagnosis not present

## 2023-03-08 DIAGNOSIS — H353122 Nonexudative age-related macular degeneration, left eye, intermediate dry stage: Secondary | ICD-10-CM | POA: Diagnosis not present

## 2023-04-05 ENCOUNTER — Encounter (INDEPENDENT_AMBULATORY_CARE_PROVIDER_SITE_OTHER): Payer: Medicare Other | Admitting: Ophthalmology

## 2023-04-05 DIAGNOSIS — H43813 Vitreous degeneration, bilateral: Secondary | ICD-10-CM | POA: Diagnosis not present

## 2023-04-05 DIAGNOSIS — H353122 Nonexudative age-related macular degeneration, left eye, intermediate dry stage: Secondary | ICD-10-CM

## 2023-04-05 DIAGNOSIS — H353211 Exudative age-related macular degeneration, right eye, with active choroidal neovascularization: Secondary | ICD-10-CM

## 2023-05-03 ENCOUNTER — Encounter (INDEPENDENT_AMBULATORY_CARE_PROVIDER_SITE_OTHER): Payer: Medicare Other | Admitting: Ophthalmology

## 2023-05-03 DIAGNOSIS — H43813 Vitreous degeneration, bilateral: Secondary | ICD-10-CM

## 2023-05-03 DIAGNOSIS — H353211 Exudative age-related macular degeneration, right eye, with active choroidal neovascularization: Secondary | ICD-10-CM

## 2023-05-03 DIAGNOSIS — H353122 Nonexudative age-related macular degeneration, left eye, intermediate dry stage: Secondary | ICD-10-CM | POA: Diagnosis not present

## 2023-05-31 ENCOUNTER — Encounter (INDEPENDENT_AMBULATORY_CARE_PROVIDER_SITE_OTHER): Payer: Medicare Other | Admitting: Ophthalmology

## 2023-05-31 DIAGNOSIS — H43813 Vitreous degeneration, bilateral: Secondary | ICD-10-CM

## 2023-05-31 DIAGNOSIS — H353122 Nonexudative age-related macular degeneration, left eye, intermediate dry stage: Secondary | ICD-10-CM

## 2023-05-31 DIAGNOSIS — H353211 Exudative age-related macular degeneration, right eye, with active choroidal neovascularization: Secondary | ICD-10-CM

## 2023-06-28 ENCOUNTER — Encounter (INDEPENDENT_AMBULATORY_CARE_PROVIDER_SITE_OTHER): Payer: Medicare Other | Admitting: Ophthalmology

## 2023-06-28 DIAGNOSIS — H353211 Exudative age-related macular degeneration, right eye, with active choroidal neovascularization: Secondary | ICD-10-CM

## 2023-06-28 DIAGNOSIS — H43813 Vitreous degeneration, bilateral: Secondary | ICD-10-CM

## 2023-06-28 DIAGNOSIS — H353122 Nonexudative age-related macular degeneration, left eye, intermediate dry stage: Secondary | ICD-10-CM | POA: Diagnosis not present

## 2023-07-25 ENCOUNTER — Encounter (INDEPENDENT_AMBULATORY_CARE_PROVIDER_SITE_OTHER): Payer: Medicare Other | Admitting: Ophthalmology

## 2023-07-25 DIAGNOSIS — H353211 Exudative age-related macular degeneration, right eye, with active choroidal neovascularization: Secondary | ICD-10-CM | POA: Diagnosis not present

## 2023-07-25 DIAGNOSIS — H353122 Nonexudative age-related macular degeneration, left eye, intermediate dry stage: Secondary | ICD-10-CM | POA: Diagnosis not present

## 2023-07-25 DIAGNOSIS — H43813 Vitreous degeneration, bilateral: Secondary | ICD-10-CM | POA: Diagnosis not present

## 2023-08-23 ENCOUNTER — Encounter (INDEPENDENT_AMBULATORY_CARE_PROVIDER_SITE_OTHER): Payer: Medicare Other | Admitting: Ophthalmology

## 2023-08-23 DIAGNOSIS — H353211 Exudative age-related macular degeneration, right eye, with active choroidal neovascularization: Secondary | ICD-10-CM | POA: Diagnosis not present

## 2023-08-23 DIAGNOSIS — H353122 Nonexudative age-related macular degeneration, left eye, intermediate dry stage: Secondary | ICD-10-CM

## 2023-08-23 DIAGNOSIS — H43813 Vitreous degeneration, bilateral: Secondary | ICD-10-CM | POA: Diagnosis not present

## 2023-09-20 ENCOUNTER — Encounter (INDEPENDENT_AMBULATORY_CARE_PROVIDER_SITE_OTHER): Payer: Medicare Other | Admitting: Ophthalmology

## 2023-09-20 DIAGNOSIS — H353122 Nonexudative age-related macular degeneration, left eye, intermediate dry stage: Secondary | ICD-10-CM | POA: Diagnosis not present

## 2023-09-20 DIAGNOSIS — H43813 Vitreous degeneration, bilateral: Secondary | ICD-10-CM | POA: Diagnosis not present

## 2023-09-20 DIAGNOSIS — H353211 Exudative age-related macular degeneration, right eye, with active choroidal neovascularization: Secondary | ICD-10-CM | POA: Diagnosis not present

## 2023-10-18 ENCOUNTER — Encounter (INDEPENDENT_AMBULATORY_CARE_PROVIDER_SITE_OTHER): Payer: Medicare Other | Admitting: Ophthalmology

## 2023-10-18 DIAGNOSIS — H353121 Nonexudative age-related macular degeneration, left eye, early dry stage: Secondary | ICD-10-CM

## 2023-10-18 DIAGNOSIS — H43813 Vitreous degeneration, bilateral: Secondary | ICD-10-CM

## 2023-10-18 DIAGNOSIS — H353211 Exudative age-related macular degeneration, right eye, with active choroidal neovascularization: Secondary | ICD-10-CM

## 2023-11-15 ENCOUNTER — Encounter (INDEPENDENT_AMBULATORY_CARE_PROVIDER_SITE_OTHER): Payer: Medicare Other | Admitting: Ophthalmology

## 2023-11-15 DIAGNOSIS — H353122 Nonexudative age-related macular degeneration, left eye, intermediate dry stage: Secondary | ICD-10-CM | POA: Diagnosis not present

## 2023-11-15 DIAGNOSIS — H43813 Vitreous degeneration, bilateral: Secondary | ICD-10-CM | POA: Diagnosis not present

## 2023-11-15 DIAGNOSIS — H353211 Exudative age-related macular degeneration, right eye, with active choroidal neovascularization: Secondary | ICD-10-CM | POA: Diagnosis not present

## 2023-12-13 ENCOUNTER — Encounter (INDEPENDENT_AMBULATORY_CARE_PROVIDER_SITE_OTHER): Payer: Medicare Other | Admitting: Ophthalmology

## 2023-12-13 DIAGNOSIS — H353211 Exudative age-related macular degeneration, right eye, with active choroidal neovascularization: Secondary | ICD-10-CM

## 2023-12-13 DIAGNOSIS — H353122 Nonexudative age-related macular degeneration, left eye, intermediate dry stage: Secondary | ICD-10-CM | POA: Diagnosis not present

## 2023-12-13 DIAGNOSIS — H43813 Vitreous degeneration, bilateral: Secondary | ICD-10-CM

## 2024-01-10 ENCOUNTER — Encounter (INDEPENDENT_AMBULATORY_CARE_PROVIDER_SITE_OTHER): Admitting: Ophthalmology

## 2024-01-10 DIAGNOSIS — H353122 Nonexudative age-related macular degeneration, left eye, intermediate dry stage: Secondary | ICD-10-CM | POA: Diagnosis not present

## 2024-01-10 DIAGNOSIS — H43813 Vitreous degeneration, bilateral: Secondary | ICD-10-CM

## 2024-01-10 DIAGNOSIS — H353211 Exudative age-related macular degeneration, right eye, with active choroidal neovascularization: Secondary | ICD-10-CM

## 2024-02-14 ENCOUNTER — Encounter (INDEPENDENT_AMBULATORY_CARE_PROVIDER_SITE_OTHER): Admitting: Ophthalmology

## 2024-02-14 DIAGNOSIS — H353211 Exudative age-related macular degeneration, right eye, with active choroidal neovascularization: Secondary | ICD-10-CM | POA: Diagnosis not present

## 2024-02-14 DIAGNOSIS — H43813 Vitreous degeneration, bilateral: Secondary | ICD-10-CM

## 2024-02-14 DIAGNOSIS — H353122 Nonexudative age-related macular degeneration, left eye, intermediate dry stage: Secondary | ICD-10-CM

## 2024-03-13 ENCOUNTER — Encounter (INDEPENDENT_AMBULATORY_CARE_PROVIDER_SITE_OTHER): Admitting: Ophthalmology

## 2024-03-13 DIAGNOSIS — H43813 Vitreous degeneration, bilateral: Secondary | ICD-10-CM | POA: Diagnosis not present

## 2024-03-13 DIAGNOSIS — H353122 Nonexudative age-related macular degeneration, left eye, intermediate dry stage: Secondary | ICD-10-CM

## 2024-03-13 DIAGNOSIS — H353211 Exudative age-related macular degeneration, right eye, with active choroidal neovascularization: Secondary | ICD-10-CM | POA: Diagnosis not present

## 2024-04-10 ENCOUNTER — Encounter (INDEPENDENT_AMBULATORY_CARE_PROVIDER_SITE_OTHER): Admitting: Ophthalmology

## 2024-04-10 DIAGNOSIS — H353211 Exudative age-related macular degeneration, right eye, with active choroidal neovascularization: Secondary | ICD-10-CM | POA: Diagnosis not present

## 2024-04-10 DIAGNOSIS — H43813 Vitreous degeneration, bilateral: Secondary | ICD-10-CM

## 2024-04-10 DIAGNOSIS — H353122 Nonexudative age-related macular degeneration, left eye, intermediate dry stage: Secondary | ICD-10-CM | POA: Diagnosis not present

## 2024-05-15 ENCOUNTER — Encounter (INDEPENDENT_AMBULATORY_CARE_PROVIDER_SITE_OTHER): Admitting: Ophthalmology

## 2024-05-15 DIAGNOSIS — H43813 Vitreous degeneration, bilateral: Secondary | ICD-10-CM | POA: Diagnosis not present

## 2024-05-15 DIAGNOSIS — H353122 Nonexudative age-related macular degeneration, left eye, intermediate dry stage: Secondary | ICD-10-CM | POA: Diagnosis not present

## 2024-05-15 DIAGNOSIS — H353211 Exudative age-related macular degeneration, right eye, with active choroidal neovascularization: Secondary | ICD-10-CM

## 2024-06-12 ENCOUNTER — Encounter (INDEPENDENT_AMBULATORY_CARE_PROVIDER_SITE_OTHER): Admitting: Ophthalmology

## 2024-06-12 DIAGNOSIS — H353211 Exudative age-related macular degeneration, right eye, with active choroidal neovascularization: Secondary | ICD-10-CM | POA: Diagnosis not present

## 2024-06-12 DIAGNOSIS — H43813 Vitreous degeneration, bilateral: Secondary | ICD-10-CM

## 2024-06-12 DIAGNOSIS — H353122 Nonexudative age-related macular degeneration, left eye, intermediate dry stage: Secondary | ICD-10-CM | POA: Diagnosis not present

## 2024-07-10 ENCOUNTER — Encounter (INDEPENDENT_AMBULATORY_CARE_PROVIDER_SITE_OTHER): Admitting: Ophthalmology

## 2024-07-10 DIAGNOSIS — H43813 Vitreous degeneration, bilateral: Secondary | ICD-10-CM | POA: Diagnosis not present

## 2024-07-10 DIAGNOSIS — H353211 Exudative age-related macular degeneration, right eye, with active choroidal neovascularization: Secondary | ICD-10-CM

## 2024-07-10 DIAGNOSIS — H353122 Nonexudative age-related macular degeneration, left eye, intermediate dry stage: Secondary | ICD-10-CM

## 2024-08-07 ENCOUNTER — Encounter (INDEPENDENT_AMBULATORY_CARE_PROVIDER_SITE_OTHER): Admitting: Ophthalmology

## 2024-08-07 DIAGNOSIS — H43813 Vitreous degeneration, bilateral: Secondary | ICD-10-CM

## 2024-08-07 DIAGNOSIS — H353122 Nonexudative age-related macular degeneration, left eye, intermediate dry stage: Secondary | ICD-10-CM | POA: Diagnosis not present

## 2024-08-07 DIAGNOSIS — H353211 Exudative age-related macular degeneration, right eye, with active choroidal neovascularization: Secondary | ICD-10-CM

## 2024-09-11 ENCOUNTER — Encounter (INDEPENDENT_AMBULATORY_CARE_PROVIDER_SITE_OTHER): Admitting: Ophthalmology

## 2024-09-11 DIAGNOSIS — H43813 Vitreous degeneration, bilateral: Secondary | ICD-10-CM

## 2024-09-11 DIAGNOSIS — H353122 Nonexudative age-related macular degeneration, left eye, intermediate dry stage: Secondary | ICD-10-CM | POA: Diagnosis not present

## 2024-09-11 DIAGNOSIS — H353211 Exudative age-related macular degeneration, right eye, with active choroidal neovascularization: Secondary | ICD-10-CM | POA: Diagnosis not present

## 2024-10-17 ENCOUNTER — Encounter (INDEPENDENT_AMBULATORY_CARE_PROVIDER_SITE_OTHER): Admitting: Ophthalmology

## 2024-10-17 DIAGNOSIS — H43813 Vitreous degeneration, bilateral: Secondary | ICD-10-CM | POA: Diagnosis not present

## 2024-10-17 DIAGNOSIS — H353211 Exudative age-related macular degeneration, right eye, with active choroidal neovascularization: Secondary | ICD-10-CM | POA: Diagnosis not present

## 2024-10-17 DIAGNOSIS — H353122 Nonexudative age-related macular degeneration, left eye, intermediate dry stage: Secondary | ICD-10-CM | POA: Diagnosis not present

## 2024-11-21 ENCOUNTER — Encounter (INDEPENDENT_AMBULATORY_CARE_PROVIDER_SITE_OTHER): Admitting: Ophthalmology
